# Patient Record
Sex: Female | Born: 2000
Health system: Southern US, Community
[De-identification: ages and names within clinical notes are randomized; demographics above are authoritative.]

---
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
## 2023-10-27 ENCOUNTER — Ambulatory Visit
Admission: EM | Admit: 2023-10-27 | Discharge: 2023-10-27 | Disposition: A | Discharge status: Home / Self Care | Attending: Family Medicine | Admitting: Family Medicine

## 2023-10-27 DIAGNOSIS — R202 Paresthesia of skin: Secondary | ICD-10-CM | POA: Diagnosis not present

## 2023-10-27 NOTE — ED Provider Notes (Signed)
 [facility]  Note:  This document was prepared using Conservation officer, historic buildings and may include unintentional dictation errors.  MRN: [id number] DOB: 2000-03-25  Subjective:   Brianna Mason is a 23 y.o. female presenting for acute onset this afternoon of multiple neurologic symptoms including tingling of the left side of her face, tongue and left hand, left foot.  No headache, confusion, weakness, numbness, vision changes.  Patient is currently taking Augmentin and Atrovent  for a bilateral ear infection and sinus infection.  She was seen last week and had similar symptoms over the right arm and ultimately was diagnosed with the after mentioned conditions.  No history of neurologic disorders.  No current facility-administered medications for this encounter.  Current Outpatient Medications:    amoxicillin-clavulanate (AUGMENTIN) 875-125 MG tablet, SMARTSIG:1 Tablet(s) By Mouth Every 12 Hours, Disp: , Rfl:    clindamycin (CLEOCIN T) 1 % external solution, Apply once to twice daily to all lesions, Disp: , Rfl:    doxycycline (VIBRA-TABS) 100 MG tablet, Take by mouth., Disp: , Rfl:    ipratropium (ATROVENT ) 0.03 % nasal spray, Place 2 sprays into both nostrils 2 (two) times daily., Disp: , Rfl:    benzonatate  (TESSALON ) 100 MG capsule, Take 2 capsules (200 mg total) by mouth 3 (three) times daily as needed for cough., Disp: 21 capsule, Rfl: 0   cyclobenzaprine  (FLEXERIL ) 10 MG tablet, Take 1 tablet (10 mg total) by mouth at bedtime., Disp: 12 tablet, Rfl: 0   ipratropium (ATROVENT ) 0.06 % nasal spray, Place 2 sprays into both nostrils 4 (four) times daily., Disp: 15 mL, Rfl: 0   naproxen  (NAPROSYN ) 375 MG tablet, Take 1 tablet (375 mg total) by mouth 2 (two) times daily as needed for moderate pain., Disp: 20 tablet, Rfl: 0   Allergies  Allergen Reactions   Tree Extract Hives, Itching, Shortness Of Breath and Swelling    History reviewed. No pertinent past  medical history.   History reviewed. No pertinent surgical history.  History reviewed. No pertinent family history.  Social History   Tobacco Use   Smoking status: Passive Smoke Exposure - Never Smoker   Smokeless tobacco: Never  Vaping Use   Vaping status: Never Used  Substance Use Topics   Alcohol use: Not Currently   Drug use: No    ROS   Objective:   Vitals: BP 131/80 (BP Location: Right Arm)   Pulse 94   Temp 99.3 F (37.4 C) (Oral)   Resp 16   LMP 10/20/2023 (Exact Date)   SpO2 97%   Physical Exam Constitutional:      General: She is not in acute distress.    Appearance: Normal appearance. She is well-developed and normal weight. She is not ill-appearing, toxic-appearing or diaphoretic.  HENT:     Head: Normocephalic and atraumatic.     Right Ear: Tympanic membrane, ear canal and external ear normal. No drainage or tenderness. No middle ear effusion. There is no impacted cerumen. Tympanic membrane is not erythematous or bulging.     Left Ear: Tympanic membrane, ear canal and external ear normal. No drainage or tenderness.  No middle ear effusion. There is no impacted cerumen. Tympanic membrane is not erythematous or bulging.     Nose: Nose normal. No congestion or rhinorrhea.     Mouth/Throat:     Mouth: Mucous membranes are moist. No oral lesions.     Pharynx: No pharyngeal swelling, oropharyngeal exudate, posterior oropharyngeal erythema or uvula swelling.  Tonsils: No tonsillar exudate or tonsillar abscesses.  Eyes:     General: No scleral icterus.       Right eye: No discharge.        Left eye: No discharge.     Extraocular Movements: Extraocular movements intact.     Right eye: Normal extraocular motion.     Left eye: Normal extraocular motion.     Conjunctiva/sclera: Conjunctivae normal.  Cardiovascular:     Rate and Rhythm: Normal rate and regular rhythm.     Heart sounds: Normal heart sounds. No murmur heard.    No friction rub. No gallop.   Pulmonary:     Effort: Pulmonary effort is normal. No respiratory distress.     Breath sounds: No stridor. No wheezing, rhonchi or rales.  Chest:     Chest wall: No tenderness.  Musculoskeletal:     Cervical back: Normal range of motion and neck supple.  Lymphadenopathy:     Cervical: No cervical adenopathy.  Skin:    General: Skin is warm and dry.  Neurological:     General: No focal deficit present.     Mental Status: She is alert and oriented to person, place, and time.     Cranial Nerves: No cranial nerve deficit.     Motor: No weakness.     Coordination: Coordination normal.     Gait: Gait normal.     Deep Tendon Reflexes: Reflexes normal.  Psychiatric:        Mood and Affect: Mood normal.        Behavior: Behavior normal.        Thought Content: Thought content normal.        Judgment: Judgment normal.     Assessment and Plan :   PDMP not reviewed this encounter.  1. Tingling    Will pursue basic labs including a comprehensive metabolic panel and CBC with differential.  Maintain current treatment as prescribed last week.  Follow-up with PCP to pursue further testing as deemed appropriate.  Counseled patient on potential for adverse effects with medications prescribed/recommended today, ER and return-to-clinic precautions discussed, patient verbalized understanding.    Christopher Savannah, [place] 10/28/23 [phone]

## 2023-10-27 NOTE — ED Triage Notes (Signed)
 Pt reports tingling in the left sie of face, tongue, left hand and left foot started today 1200 today when she was working on a computer from home. States last week she had tingling in the right arm and was diagnosed with bilateral ear infection and sinus infection, taking Augmentin and Atrovent .

## 2023-10-29 ENCOUNTER — Ambulatory Visit: Payer: Self-pay

## 2023-10-29 LAB — CBC WITH DIFFERENTIAL/PLATELET
Basophils Absolute: 0.1 x10E3/uL (ref 0.0–0.2)
Basos: 1 %
EOS (ABSOLUTE): 0.1 x10E3/uL (ref 0.0–0.4)
Eos: 1 %
Hematocrit: 42 % (ref 34.0–46.6)
Hemoglobin: 13.1 g/dL (ref 11.1–15.9)
Immature Grans (Abs): 0 x10E3/uL (ref 0.0–0.1)
Immature Granulocytes: 0 %
Lymphocytes Absolute: 2 x10E3/uL (ref 0.7–3.1)
Lymphs: 22 %
MCH: 28.5 pg (ref 26.6–33.0)
MCHC: 31.2 g/dL — ABNORMAL LOW (ref 31.5–35.7)
MCV: 92 fL (ref 79–97)
Monocytes Absolute: 0.5 x10E3/uL (ref 0.1–0.9)
Monocytes: 5 %
Neutrophils Absolute: 6.4 x10E3/uL (ref 1.4–7.0)
Neutrophils: 71 %
Platelets: 292 x10E3/uL (ref 150–450)
RBC: 4.59 x10E6/uL (ref 3.77–5.28)
RDW: 13 % (ref 11.7–15.4)
WBC: 9.1 x10E3/uL (ref 3.4–10.8)

## 2023-10-29 LAB — COMPREHENSIVE METABOLIC PANEL WITH GFR
ALT: 13 IU/L (ref 0–32)
AST: 23 IU/L (ref 0–40)
Albumin: 4.2 g/dL (ref 4.0–5.0)
Alkaline Phosphatase: 62 IU/L (ref 44–121)
BUN/Creatinine Ratio: 17 (ref 9–23)
BUN: 12 mg/dL (ref 6–20)
Bilirubin Total: <0.2 mg/dL (ref 0.0–1.2)
CO2: 22 mmol/L (ref 20–29)
Calcium: 9.4 mg/dL (ref 8.7–10.2)
Chloride: 103 mmol/L (ref 96–106)
Creatinine, Ser: 0.71 mg/dL (ref 0.57–1.00)
Globulin, Total: 2.5 g/dL (ref 1.5–4.5)
Glucose: 91 mg/dL (ref 70–99)
Potassium: 4.4 mmol/L (ref 3.5–5.2)
Sodium: 138 mmol/L (ref 134–144)
Total Protein: 6.7 g/dL (ref 6.0–8.5)
eGFR: 121 mL/min/1.73 (ref >59)
